# Patient Record
Sex: Female | Born: 1975 | Race: Black or African American | Hispanic: No | Marital: Single | Smoking: Former smoker
Health system: Southern US, Community
[De-identification: ages and names within clinical notes are randomized; demographics above are authoritative.]

## PROBLEM LIST (undated history)

## (undated) DIAGNOSIS — R600 Localized edema: Secondary | ICD-10-CM

## (undated) HISTORY — PX: TONSILLECTOMY: SUR1361

---
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
## 2007-03-10 ENCOUNTER — Emergency Department: Payer: Self-pay | Admitting: Emergency Medicine

## 2007-03-12 ENCOUNTER — Emergency Department: Payer: Self-pay

## 2007-03-13 ENCOUNTER — Emergency Department: Payer: Self-pay | Admitting: Emergency Medicine

## 2007-03-17 ENCOUNTER — Emergency Department: Payer: Self-pay | Admitting: Emergency Medicine

## 2007-11-01 ENCOUNTER — Emergency Department: Payer: Self-pay | Admitting: Emergency Medicine

## 2008-01-05 ENCOUNTER — Emergency Department: Payer: Self-pay | Admitting: Emergency Medicine

## 2008-01-17 ENCOUNTER — Ambulatory Visit: Payer: Self-pay | Admitting: Family Medicine

## 2008-05-23 IMAGING — CT CT ABD-PELV W/O CM
1 of 3 series · 13 of 32 positions shown, 19 images · non-contrast
Comparison: none

REASON FOR EXAM: (1) abd pain; (2) abd pain
COMMENTS:

PROCEDURE:     CT  - CT ABDOMEN AND PELVIS W[DATE]  [DATE]
RESULT:
HISTORY: Abdominal pain.

[Series 6: stone · axial · 0.82mm/px · z∈[-302,-26]mm · 13 of 108 slices shown, 19 images]
[im 8/108  soft-tissue]
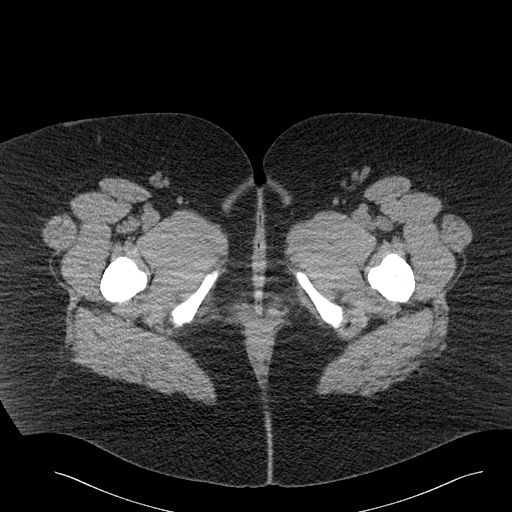
[im 8/108  bone]
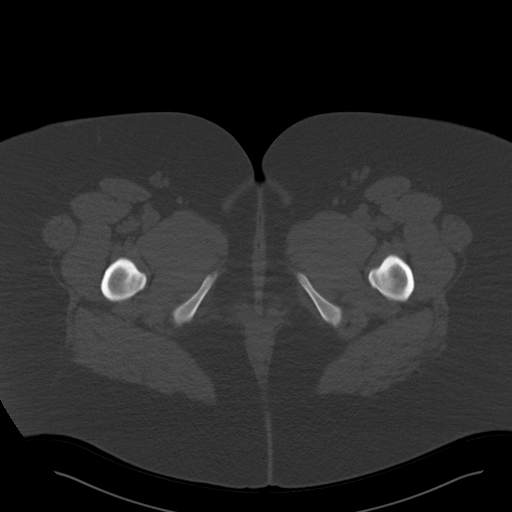
[im 15/108  soft-tissue]
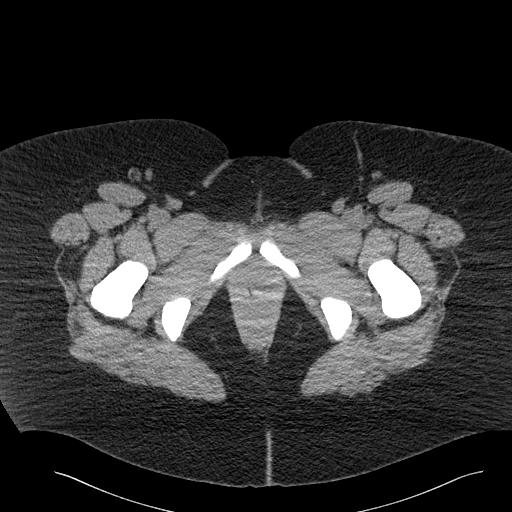
[im 22/108  soft-tissue]
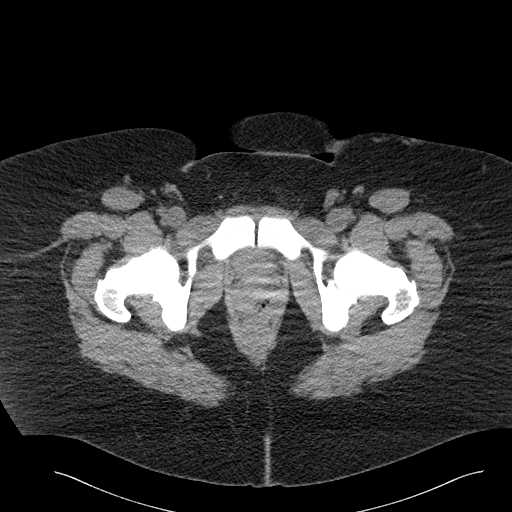
[im 29/108  soft-tissue]
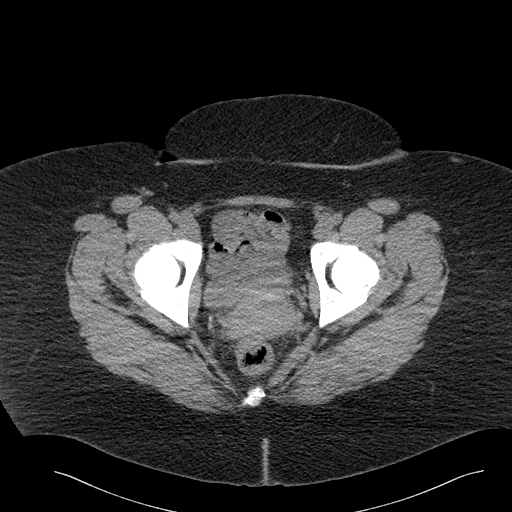
[im 36/108  soft-tissue]
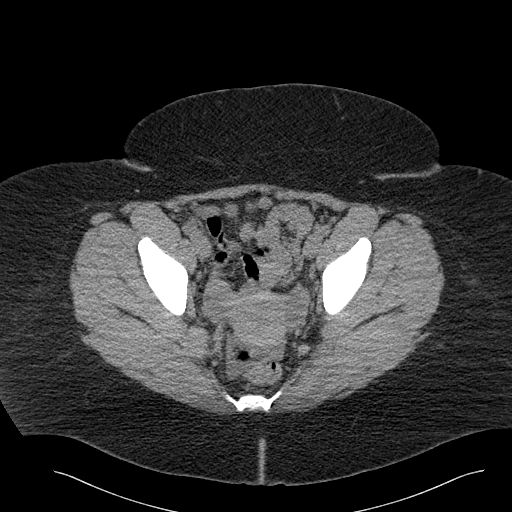
[im 43/108  soft-tissue]
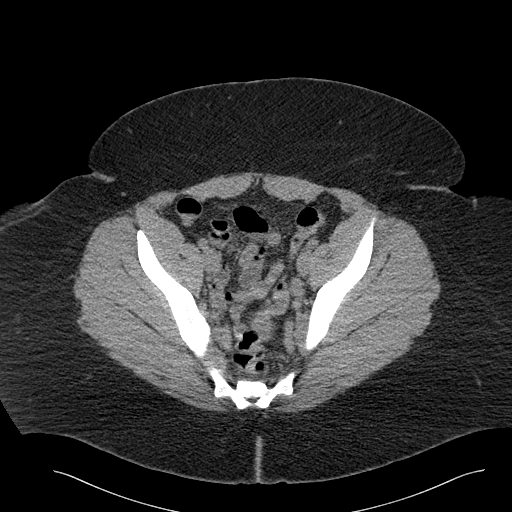
[im 58/108  soft-tissue]
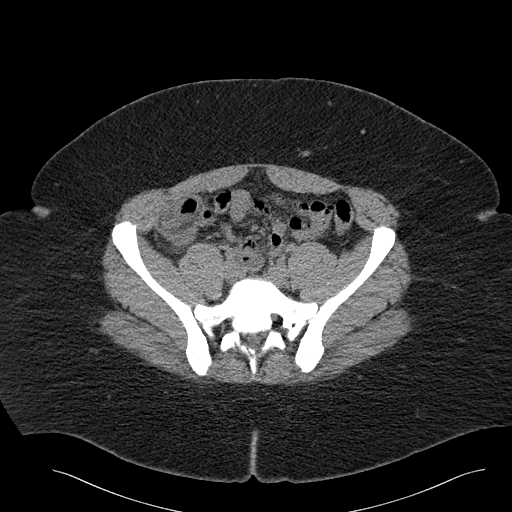
[im 65/108  soft-tissue]
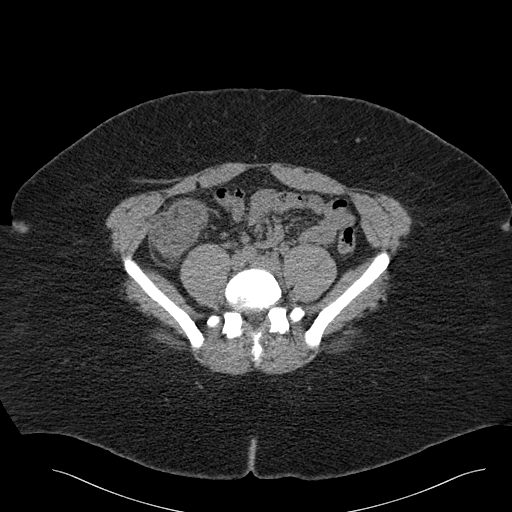
[im 72/108  soft-tissue]
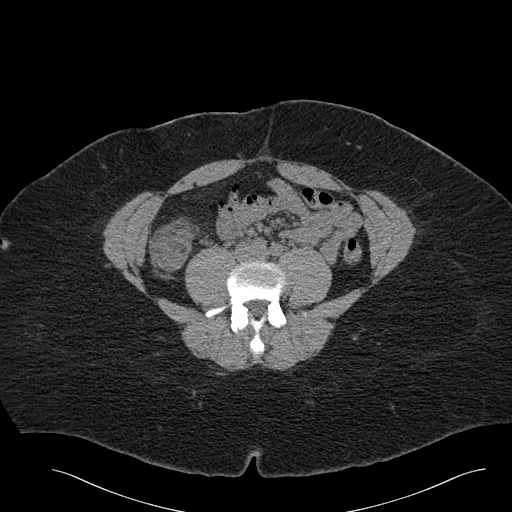
[im 72/108  bone]
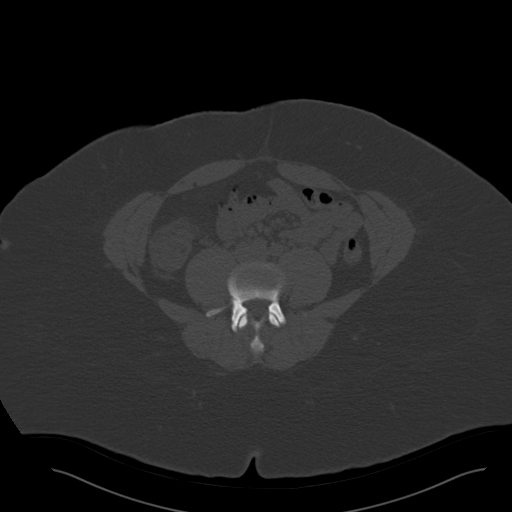
[im 79/108  soft-tissue]
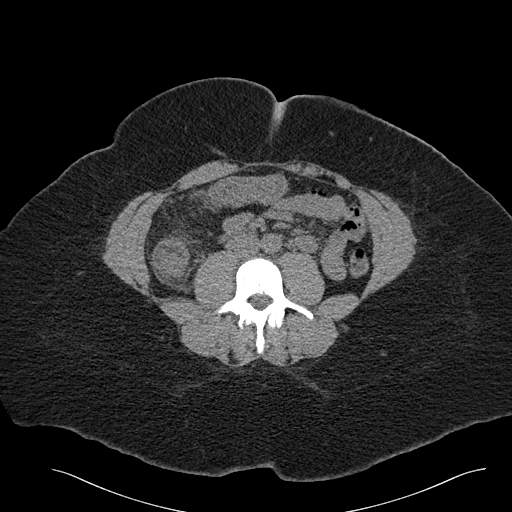
[im 79/108  lung]
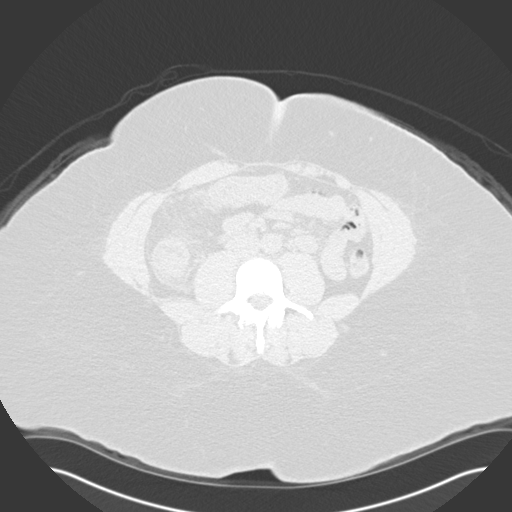
[im 86/108  soft-tissue]
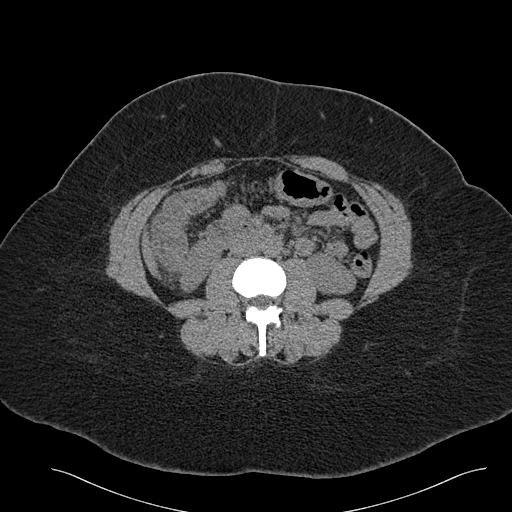
[im 86/108  lung]
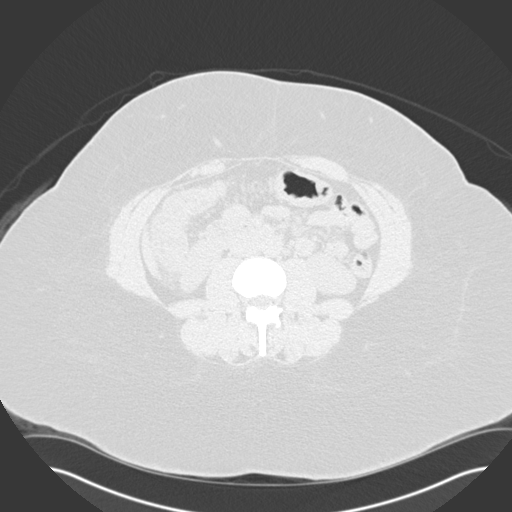
[im 93/108  soft-tissue]
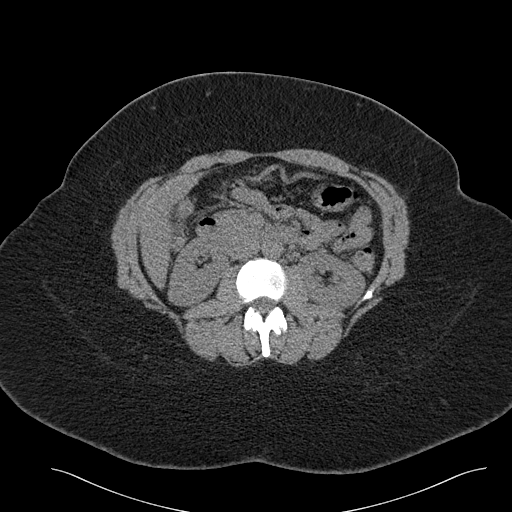
[im 93/108  lung]
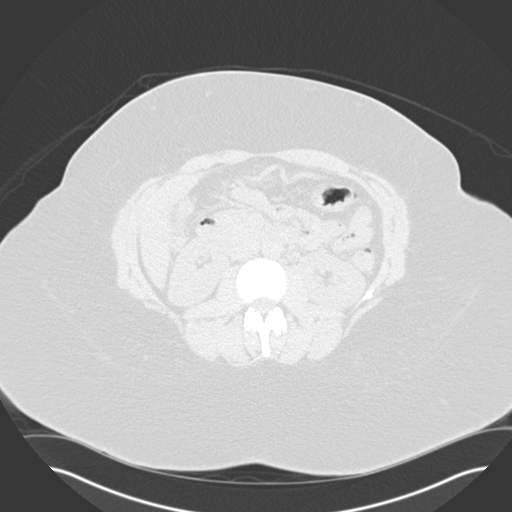
[im 100/108  soft-tissue]
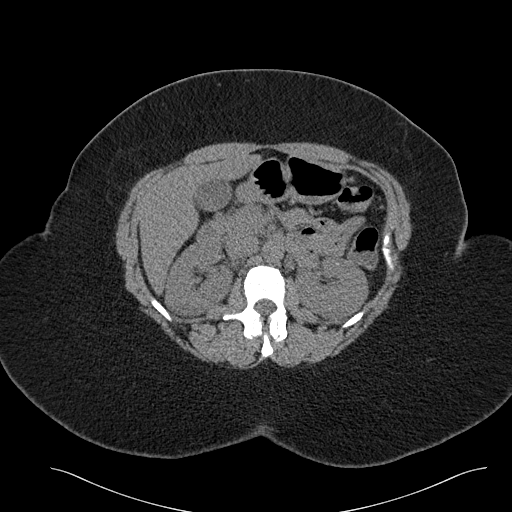
[im 100/108  lung]
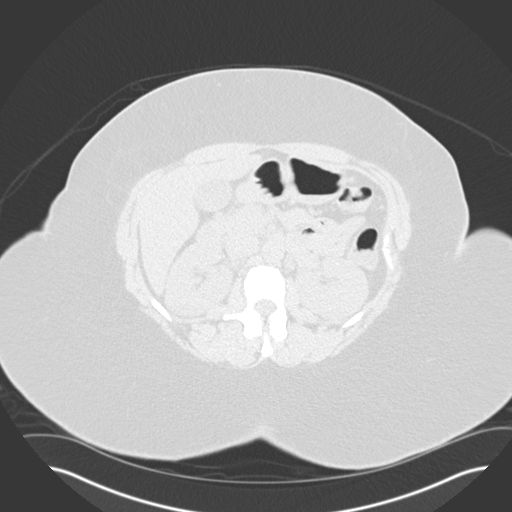

[13 of 32 positions shown; findings below may reference images not displayed]

COMPARISON STUDIES: No recent.

PROCEDURE AND FINDINGS: Standard CT of the abdomen and pelvis is obtained.
The liver and spleen are normal.  The pancreas is normal.  The adrenals are
normal. No focal renal abnormalities are identified.  Mild edema is noted
about the colon on the RIGHT.  What appears to be the appendix is
unremarkable. The edema about the RIGHT colon could be related to
diverticulitis or mild colitis. Malignancy would be less likely. No
hydronephrosis or hydroureter is noted. The pelvis is unremarkable. No
inguinal adenopathy is noted. The lung bases are clear.
IMPRESSION: 1.     Mild edema noted about the RIGHT colon as described above. An oral
contrast-enhanced and IV contrast CT can be obtained if further evaluation
is needed. What appears to be the appendix is normal.
2.     Abdominal aorta and GU system are normal.
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
## 2008-08-23 ENCOUNTER — Emergency Department: Payer: Self-pay | Admitting: Emergency Medicine

## 2011-07-30 ENCOUNTER — Emergency Department: Payer: Self-pay | Admitting: Emergency Medicine

## 2012-02-21 ENCOUNTER — Emergency Department: Payer: Self-pay | Admitting: Emergency Medicine

## 2019-11-29 ENCOUNTER — Other Ambulatory Visit: Payer: Self-pay

## 2019-11-29 ENCOUNTER — Encounter: Payer: Self-pay | Admitting: Physician Assistant

## 2019-11-29 ENCOUNTER — Ambulatory Visit (LOCAL_COMMUNITY_HEALTH_CENTER): Payer: Self-pay | Admitting: Physician Assistant

## 2019-11-29 VITALS — BP 133/83 | Ht 63.0 in | Wt 314.6 lb

## 2019-11-29 DIAGNOSIS — G43829 Menstrual migraine, not intractable, without status migrainosus: Secondary | ICD-10-CM | POA: Insufficient documentation

## 2019-11-29 DIAGNOSIS — Z6841 Body Mass Index (BMI) 40.0 and over, adult: Secondary | ICD-10-CM | POA: Insufficient documentation

## 2019-11-29 DIAGNOSIS — Z309 Encounter for contraceptive management, unspecified: Secondary | ICD-10-CM

## 2019-11-29 HISTORY — DX: Body Mass Index (BMI) 40.0 and over, adult: Z684

## 2019-11-29 HISTORY — DX: Menstrual migraine, not intractable, without status migrainosus: G43.829

## 2019-11-29 LAB — WET PREP FOR TRICH, YEAST, CLUE
Trichomonas Exam: NEGATIVE
Yeast Exam: NEGATIVE

## 2019-11-29 MED ORDER — NORETHINDRONE 0.35 MG PO TABS: 1.0000 | ORAL_TABLET | Freq: Every day | ORAL | 0 refills | Status: DC

## 2019-11-29 NOTE — Progress Notes (Signed)
Patient here for [facility]. Patient states her primary doctor would not give her additional OC and recommended coming to [facility]. States she has been taking OC continuously and should have started new pack on 11/25/19. Needs new prescription for OC. Marland KitchenBurt Knack, RN

## 2019-11-29 NOTE — Progress Notes (Signed)
Family Planning Visit- Repeat Yearly Visit  Subjective:  Caroline Crosby is a 44 y.o. being seen today for an well woman visit and to discuss family planning options.    She is currently using oral progesterone-only contraceptive for pregnancy prevention (ran out a few days ago). Patient reports she does not want a pregnancy in the next year. Patient  has BMI 50.0-59.9, adult (HCC) and Migraine, menstrual on their problem list. States last Pap about 2 years ago and was normal, no hx abnormal. No h/o mammogram [id number], had term C/S 1995 due to breech. Quit smoking >10 y ago. LMP 11/19/19, last sex just prior to that, 1 stable sex partner, always uses condoms. States has been on Sprintec with traditional dosing and likes having regular periods.  Chief Complaint  Patient presents with  . Contraception    OC    Patient reports she feels well today.   Patient denies concerns.   Does the patient desire a pregnancy in the next year? ([facility] flowsheet)  See flowsheet for other program required questions.   Body mass index is 55.73 kg/m. - Patient is eligible for diabetes screening based on BMI and age >86?  yes HA1C ordered? yes  Patient reports 1 of partners in last year. Desires STI screening?  Yes  Does the patient have a current or past history of drug use? No   No components found for: HCV]   Health Maintenance Due  Topic Date Due  . HIV Screening  04/13/1993  . TETANUS/TDAP  04/13/1997  . PAP SMEAR-Modifier  04/14/1999  . INFLUENZA VACCINE  06/02/2019    ROS  The following portions of the patient's history were reviewed and updated as appropriate: allergies, current medications, past family history, past medical history, past social history, past surgical history and problem list. Problem list updated.  Objective:   Vitals:   11/29/19 0900  BP: 133/83  Weight: (!) 314 lb 9.6 oz (142.7 kg)  Height: 5\' 3"  (1.6 m)    Physical Exam Constitutional:      Appearance: She is  obese.  HENT:     Head: Normocephalic.  Pulmonary:     Effort: Pulmonary effort is normal.  Abdominal:     Palpations: Abdomen is soft.  Neurological:     Mental Status: She is alert and oriented to person, place, and time.  Psychiatric:        Mood and Affect: Mood normal.        Behavior: Behavior normal.        Judgment: Judgment normal.       Assessment and Plan:  Caroline Crosby is a 44 y.o. female presenting to the [facility] Department for an initial well woman exam/family planning visit  Contraception counseling: Reviewed all forms of birth control options in the tiered based approach. available including abstinence; over the counter/barrier methods; hormonal contraceptive medication including pill, patch, ring, injection,contraceptive implant; hormonal and nonhormonal IUDs; permanent sterilization options including vasectomy and the various tubal sterilization modalities. Risks, benefits, and typical effectiveness rates were reviewed.  Questions were answered.  Written information was also given to the patient to review.  Patient desires OCPs, pt counseled that progestin-only pill is safest choice in light of h/o migraine HA, this was prescribed for patient. She will follow up in  3 mo for surveillance.  She was told to call with any further questions, or with any concerns about this method of contraception.  Emphasized use of condoms 100% of the  time for STI prevention.  Patient was not offered ECP, as it was not indicated at this visit.    1. Encounter for contraceptive management, unspecified type Micronor 1 po qd #3 packs. ROI for [facility] records re: Pap results last 5 years. - WET PREP FOR [place], YEAST, CLUE - HIV [place] [facility] - Syphilis Serology, [place] [facility] - Chlamydia/Gonorrhea [place] [facility]  2. BMI 50.0-59.9, adult ([place]) Enc nutritionist visit; diet/exercise HgbA1c ordered today.  3. Menstrual migraine without status migrainosus, not intractable Enc to  f/u with PCP, counseled against estrogen-containing OCPs.     Return in about 3 months (around 02/27/2020) for f/u contraceptive management.  No future appointments.  Lora Havens, PA-C

## 2019-11-29 NOTE — Progress Notes (Signed)
Patient given 3 packs Micronor per provider VO. Patient plans to apply for [facility] Medicaid, and told to call [facility] when she starts 3rd pack. Pharmacy verified. Patient counseled that depending on Medicaid status at that time, Patient will either come to clinic for more OC or prescription will be electronically sent to her pharmacy. ROI's faxed for last Pap and PE to [facility] and to [facility].Burt Knack, RN

## 2019-12-24 ENCOUNTER — Encounter: Payer: Self-pay | Admitting: Physician Assistant

## 2019-12-24 NOTE — Progress Notes (Signed)
Received records from [facility] for patient spanning from 04/2018 through 04/2019.  Per note from patient last visit, records of last pap smears were to be requested.  The records received did not contain any pap records and were shredded since they did not contain the requested information.

## 2019-12-24 NOTE — Progress Notes (Signed)
Per records from [facility], patient had pap there 2016, 2017, and 2018.  Last PE was 2018.  Pap results to be added to chart and PE in 2017 and 2018, along with pap results to be scanned into chart.

## 2020-01-24 NOTE — Addendum Note (Signed)
Addended by: Heywood Bene on: 01/24/2020 10:29 AM   Modules accepted: Orders

## 2021-12-17 ENCOUNTER — Encounter: Payer: Self-pay | Admitting: Family Medicine

## 2021-12-17 ENCOUNTER — Ambulatory Visit (LOCAL_COMMUNITY_HEALTH_CENTER): Payer: BC Managed Care – PPO | Admitting: Family Medicine

## 2021-12-17 ENCOUNTER — Other Ambulatory Visit: Payer: Self-pay

## 2021-12-17 VITALS — BP 132/75 | Ht 63.0 in | Wt 311.2 lb

## 2021-12-17 DIAGNOSIS — Z1272 Encounter for screening for malignant neoplasm of vagina: Secondary | ICD-10-CM

## 2021-12-17 DIAGNOSIS — A599 Trichomoniasis, unspecified: Secondary | ICD-10-CM

## 2021-12-17 DIAGNOSIS — R635 Abnormal weight gain: Secondary | ICD-10-CM

## 2021-12-17 DIAGNOSIS — Z01419 Encounter for gynecological examination (general) (routine) without abnormal findings: Secondary | ICD-10-CM

## 2021-12-17 DIAGNOSIS — Z3009 Encounter for other general counseling and advice on contraception: Secondary | ICD-10-CM

## 2021-12-17 DIAGNOSIS — Z113 Encounter for screening for infections with a predominantly sexual mode of transmission: Secondary | ICD-10-CM

## 2021-12-17 LAB — WET PREP FOR TRICH, YEAST, CLUE
Trichomonas Exam: POSITIVE — AB
Yeast Exam: NEGATIVE

## 2021-12-17 LAB — HM HIV SCREENING LAB: HM HIV Screening: NEGATIVE

## 2021-12-17 MED ORDER — METRONIDAZOLE 500 MG PO TABS: 500.0000 mg | ORAL_TABLET | Freq: Two times a day (BID) | ORAL | 0 refills | Status: AC

## 2021-12-17 NOTE — Progress Notes (Signed)
[place] [facility] DEPARTMENT [facility] [address] Main Number: [phone]    Family Planning Visit- Initial Visit  Subjective:  Caroline Crosby is a 46 y.o.  G4P0   being seen today for an initial annual visit and to discuss reproductive life planning.  The patient is currently using No Method - No Contraceptive Precautions for pregnancy prevention. Patient reports   does not want a pregnancy in the next year.  Patient has the following medical conditions has BMI 50.0-59.9, adult (HCC) and Migraine, menstrual on their problem list.  Chief Complaint  Patient presents with   Annual Exam    Patient reports here for physical, pap, and sti testing   Patient denies any concerns    Body mass index is 55.13 kg/m. - Patient is eligible for diabetes screening based on BMI and age >78?  yes HA1C ordered? No, pt declined, pt wants to have done at PCP.   Patient reports 2  partner/s in last year. Desires STI screening?  Yes  Has patient been screened once for HCV in the past?  No  No results found for: HCVAB  Does the patient have current drug use (including MJ), have a partner with drug use, and/or has been incarcerated since last result? Yes  If yes-- Screen for HCV through [facility]   Does the patient meet criteria for HBV testing? Yes, pt declines   Criteria:  -Household, sexual or needle sharing contact with HBV -History of drug use -HIV positive -Those with known Hep C   Health Maintenance Due  Topic Date Due   COVID-19 Vaccine (1) Never done   HIV Screening  Never done   Hepatitis C Screening  Never done   TETANUS/TDAP  Never done   INFLUENZA VACCINE  Never done    Review of Systems  Constitutional:  Negative for chills, fever, malaise/fatigue and weight loss.  HENT:  Negative for congestion, hearing loss and sore throat.   Eyes:  Negative for blurred vision, double vision and photophobia.  Respiratory:  Positive for wheezing.  Negative for shortness of breath.        Pt dx with bronchitis   Cardiovascular:  Negative for chest pain.  Gastrointestinal:  Negative for abdominal pain, blood in stool, constipation, diarrhea, heartburn, nausea and vomiting.  Genitourinary:  Negative for dysuria and frequency.  Musculoskeletal:  Negative for back pain, joint pain and neck pain.  Skin:  Negative for itching and rash.  Neurological:  Negative for dizziness, weakness and headaches.  Endo/Heme/Allergies:  Does not bruise/bleed easily.  Psychiatric/Behavioral:  Negative for depression, substance abuse and suicidal ideas.    The following portions of the patient's history were reviewed and updated as appropriate: allergies, current medications, past family history, past medical history, past social history, past surgical history and problem list. Problem list updated.   See flowsheet for other program required questions.  Objective:   Vitals:   12/17/21 0916  BP: 132/75  Weight: (!) 311 lb 3.2 oz (141.2 kg)  Height: 5\' 3"  (1.6 m)    Physical Exam Vitals and nursing note reviewed.  Constitutional:      Appearance: Normal appearance. She is obese.  HENT:     Head: Normocephalic and atraumatic.     Mouth/Throat:     Mouth: Mucous membranes are moist.     Pharynx: No oropharyngeal exudate or posterior oropharyngeal erythema.  Eyes:     General: No scleral icterus. Cardiovascular:  Rate and Rhythm: Normal rate and regular rhythm.     Pulses: Normal pulses.     Heart sounds: Normal heart sounds.  Pulmonary:     Effort: Pulmonary effort is normal.     Breath sounds: Normal breath sounds.  Abdominal:     General: Bowel sounds are normal.     Palpations: Abdomen is soft.  Genitourinary:    General: Normal vulva.     Rectum: Normal.     Comments: External genitalia without, lice, nits, erythema, edema , lesions or inguinal adenopathy. Vagina with normal mucosa and discharge and pH equals >4.  Cervix  with  petechiae and friable, no visual lesions, uterus firm, mobile, non-tender, no masses, CMT adnexal fullness or tenderness.   Musculoskeletal:        General: No swelling. Normal range of motion.     Cervical back: Normal range of motion and neck supple.     Right lower leg: Edema present.     Left lower leg: Edema present.  Skin:    General: Skin is warm and dry.  Neurological:     General: No focal deficit present.     Mental Status: She is alert and oriented to person, place, and time.  Psychiatric:        Mood and Affect: Mood normal.        Behavior: Behavior normal.     Assessment and Plan:  Caroline Crosby is a 46 y.o. female presenting to the [facility] Department for an initial annual wellness/contraceptive visit  Contraception counseling: Reviewed all forms of birth control options in the tiered based approach. available including abstinence; over the counter/barrier methods; hormonal contraceptive medication including pill, patch, ring, injection,contraceptive implant, ECP; hormonal and nonhormonal IUDs; permanent sterilization options including vasectomy and the various tubal sterilization modalities. Risks, benefits, and typical effectiveness rates were reviewed.  Questions were answered.  Written information was also given to the patient to review.  Patient desires No Method - No Contraceptive Precautions, this was prescribed for patient.    The patient will follow up in  as needed when ready for [facility].  The patient was told to call with any further questions, or with any concerns about this method of contraception.  Emphasized use of condoms 100% of the time for STI prevention.  Patient was not offered offered ECP based on patient preference.     1. Smear, vaginal, as part of routine gynecological examination Well woman exam CBE today  Pap today, per record last pap was 2018 @ [facility].  Unable to see result in care everywhere.   - IGP, Aptima HPV  2. Screening  examination for venereal disease Patient accepted all screenings including wet prep, oral, vaginal CT/GC and bloodwork for HIV/RPR.   Wet prep results  + trich    Treatment needed Discussed time line for [employer] results and that patient will be called with positive results and encouraged patient to call if she had not heard in 2 weeks.  Counseled to return or seek care for continued or worsening symptoms Recommended  not sex for the next 14 days and condom use with all sex   - HIV [place] [facility] - Syphilis Serology, [place] [facility] - WET PREP FOR TRICH, YEAST, CLUE - Chlamydia/Gonorrhea  Lab  3. Trichimoniasis  - metroNIDAZOLE (FLAGYL) 500 MG tablet; Take 1 tablet (500 mg total) by mouth 2 (two) times daily for 7 days.  Dispense: 14 tablet; Refill: 0  4.  Weight gain Pt reports about a 20 lb weight gain since 09/2021.  Pt reports that PCP has her on a "fluid " pill to take when her legs feel heavy.   Recommended to follow up with PCP,  BLE were edematous today.   Return in about 1 year (around 12/17/2022), or if symptoms worsen or fail to improve, for annual well woman exam.  No future appointments.  Wendi Mazzuca, FNP

## 2021-12-17 NOTE — Progress Notes (Signed)
Pt here for PE and Pap.  Wet mount results reviewed and medication dispensed per Provider orders.  Berdie Ogren, RN

## 2021-12-22 LAB — IGP, APTIMA HPV
HPV Aptima: NEGATIVE
PAP Smear Comment: 0

## 2022-03-30 ENCOUNTER — Other Ambulatory Visit: Payer: Self-pay | Admitting: Obstetrics and Gynecology

## 2022-04-07 NOTE — H&P (Signed)
Preoperative History and Physical   Caroline Crosby is a 46 y.o. [id number] here for surgical management of desire for permanent sterilization.   No significant preoperative concerns.   History of Present Illness: Caroline Crosby is a 46 y.o. G4P1 who is currently on no form of contraception and desiring to have a  Tubal ligation.  She has a past medical history significant for no issues.  She specifically denies a history of migraine with aura, chronic hypertension, history of DVT/PE and smoking.  Reported Caroline Crosby's last menstrual period was 03/08/2022 (exact date)..    She has regular periods, monthly, lasting 3-4 days.  She does have bad headaches and bad back pain.    Proposed surgery: robot assisted laparoscopic bilateral salpingectomy       Past Medical History:  Diagnosis Date   History of trichomoniasis           Past Surgical History:  Procedure Laterality Date   TONSILLECTOMY   1988   CESAREAN DELIVERY   1995                     OB History  Gravida Para Term Preterm AB Living  4 1     3 1   SAB IAB Ectopic Molar Multiple Live Births             1     # Outcome Date GA Lbr Len/2nd Weight Sex Delivery Anes PTL Lv  4 AB                    3 AB                    2 AB                    1 Para                 LIV  Caroline Crosby denies any other pertinent gynecologic issues.          Current Outpatient Medications on File Prior to Visit  Medication Sig Dispense Refill   FUROsemide (LASIX) 20 MG tablet Take 1 tablet (20 mg total) by mouth once daily as needed for Edema 30 tablet 3    No current facility-administered medications on file prior to visit.        Allergies  Allergen Reactions   Venom-Honey Bee Anaphylaxis      Social History:   reports that she has quit smoking. She has been exposed to tobacco smoke. She has never used smokeless tobacco. She reports current alcohol use of about 3.0 standard drinks per week. She reports current drug use. Drug: Marijuana.        Family  History  Problem Relation Age of Onset   High blood pressure (Hypertension) Mother          cholesterol issue, skin cancer twice   No Known Problems Father     No Known Problems Sister     No Known Problems Brother     No Known Problems Brother     Diabetes type II Maternal Aunt     Diabetes type II Maternal Uncle     Diabetes type II Maternal Grandmother        Review of Systems: Noncontributory   PHYSICAL EXAM: Blood pressure (!) 142/96, pulse 81, height 158.8 cm (5' 2.52"), weight (!) 133.6 kg (294 lb 9.6 oz), last menstrual period 03/08/2022. CONSTITUTIONAL: Well-developed, well-nourished female in no acute distress.  HENT:  Normocephalic, atraumatic, External right and left ear normal. Oropharynx is clear and moist EYES: Conjunctivae and EOM are normal. Pupils are equal, round, and reactive to light. No scleral icterus.  NECK: Normal range of motion, supple, no masses SKIN: Skin is warm and dry. No rash noted. Not diaphoretic. No erythema. No pallor. NEUROLGIC: Alert and oriented to person, place, and time. Normal reflexes, muscle tone coordination. No cranial nerve deficit noted. PSYCHIATRIC: Normal mood and affect. Normal behavior. Normal judgment and thought content. CARDIOVASCULAR: Normal heart rate noted, regular rhythm RESPIRATORY: Effort and breath sounds normal, no problems with respiration noted ABDOMEN: Soft, nontender, nondistended. PELVIC: Deferred MUSCULOSKELETAL: Normal range of motion. No edema and no tenderness. 2+ distal pulses.   Labs: Recent Results  No results found for this or any previous visit (from the past 336 hour(s)).     Imaging Studies: No results found.   Assessment: 1. Request for sterilization     Plan: Caroline Crosby will undergo surgical management with the above surgery.   The risks of surgery were discussed in detail with the Caroline Crosby including but not limited to: bleeding which may require transfusion or reoperation; infection which may  require antibiotics; injury to surrounding organs which may involve bowel, bladder, ureters ; need for additional procedures including laparoscopy or laparotomy; thromboembolic phenomenon, surgical site problems and other postoperative/anesthesia complications. Likelihood of success in alleviating the Caroline Crosby's condition was discussed. Routine postoperative instructions will be reviewed with the Caroline Crosby and her family in detail after surgery.  The Caroline Crosby concurred with the proposed plan, giving informed written consent for the surgery.  Preoperative prophylactic antibiotics, as indicated, and SCDs ordered on call to the OR.       Attestation Statement:    I personally performed the service. (TP)   Stpehen Petitjean Teola Bradley, MD  [facility] OB[facility] 04/07/2022 4:45 PM

## 2022-04-12 ENCOUNTER — Encounter
Admission: RE | Admit: 2022-04-12 | Discharge: 2022-04-12 | Disposition: A | Discharge status: Home / Self Care | Payer: BC Managed Care – PPO | Source: Ambulatory Visit | Attending: Obstetrics and Gynecology | Admitting: Obstetrics and Gynecology

## 2022-04-12 VITALS — Ht 63.0 in | Wt 294.0 lb

## 2022-04-12 DIAGNOSIS — Z01812 Encounter for preprocedural laboratory examination: Secondary | ICD-10-CM | POA: Diagnosis present

## 2022-04-12 DIAGNOSIS — Z302 Encounter for sterilization: Secondary | ICD-10-CM

## 2022-04-12 HISTORY — DX: Morbid (severe) obesity due to excess calories: E66.01

## 2022-04-12 LAB — TYPE AND SCREEN
ABO/RH(D): O NEG
Antibody Screen: NEGATIVE

## 2022-04-12 NOTE — Patient Instructions (Addendum)
Your procedure is scheduled on: Tuesday, June 20 Report to the Registration Desk on the 1st floor of the [facility] To find out your arrival time, please call [phone] between 1PM - 3PM on: Monday, June 19 If your arrival time is 6:00 am, do not arrive prior to that time as the [facility] entrance doors do not open until 6:00 am.  REMEMBER: Instructions that are not followed completely may result in serious medical risk, up to and including death; or upon the discretion of your surgeon and anesthesiologist your surgery may need to be rescheduled.  Do not eat food after midnight the night before surgery.  No gum chewing, lozengers or hard candies.  You may however, drink CLEAR liquids up to 2 hours before you are scheduled to arrive for your surgery. Do not drink anything within 2 hours of your scheduled arrival time.  Clear liquids include: - water  - apple juice without pulp - gatorade (not RED colors) - black coffee or tea (Do NOT add milk or creamers to the coffee or tea) Do NOT drink anything that is not on this list.  In addition, your doctor has ordered for you to drink the provided  Ensure Pre-Surgery Clear Carbohydrate Drink  Drinking this carbohydrate drink up to two hours before surgery helps to reduce insulin resistance and improve patient outcomes. Please complete drinking 2 hours prior to scheduled arrival time.  DO NOT TAKE ANY MEDICATIONS THE MORNING OF SURGERY  One week prior to surgery: starting June 13 Stop Anti-inflammatories (NSAIDS) such as Advil, Aleve, Ibuprofen, Motrin, Naproxen, Naprosyn and Aspirin based products such as Excedrin, Goodys Powder, BC Powder. Stop ANY OVER THE COUNTER supplements until after surgery. You may however, continue to take Tylenol if needed for pain up until the day of surgery.  No Alcohol for 24 hours before or after surgery.  No Smoking including e-cigarettes for 24 hours prior to surgery.  No chewable tobacco products  for at least 6 hours prior to surgery.  No nicotine patches on the day of surgery.  Do not use any "recreational" drugs for at least a week prior to your surgery.  Please be advised that the combination of cocaine and anesthesia may have negative outcomes, up to and including death. If you test positive for cocaine, your surgery will be cancelled.  On the morning of surgery brush your teeth with toothpaste and water, you may rinse your mouth with mouthwash if you wish. Do not swallow any toothpaste or mouthwash.  Use CHG Soap as directed on instruction sheet.  Do not wear jewelry, make-up, hairpins, clips or nail polish.  Do not wear lotions, powders, or perfumes.   Do not shave body from the neck down 48 hours prior to surgery just in case you cut yourself which could leave a site for infection.  Also, freshly shaved skin may become irritated if using the CHG soap.  Contact lenses, hearing aids and dentures may not be worn into surgery.  Do not bring valuables to the hospital. [facility] is not responsible for any missing/lost belongings or valuables.   Notify your doctor if there is any change in your medical condition (cold, fever, infection).  Wear comfortable clothing (specific to your surgery type) to the hospital.  After surgery, you can help prevent lung complications by doing breathing exercises.  Take deep breaths and cough every 1-2 hours. Your doctor may order a device called an Incentive Spirometer to help you take deep breaths. When  coughing or sneezing, hold a pillow firmly against your incision with both hands. This is called "splinting." Doing this helps protect your incision. It also decreases belly discomfort.  If you are being discharged the day of surgery, you will not be allowed to drive home. You will need a responsible adult (18 years or older) to drive you home and stay with you that night.   If you are taking public transportation, you will need to have a  responsible adult (18 years or older) with you. Please confirm with your physician that it is acceptable to use public transportation.   Please call the Pre-admissions Testing Dept. at [phone] if you have any questions about these instructions.  Surgery Visitation Policy:  Patients undergoing a surgery or procedure may have two family members or support persons with them as long as the person is not COVID-19 positive or experiencing its symptoms.   Preparing for Surgery with CHLORHEXIDINE GLUCONATE (CHG) Soap    Before surgery, you can play an important role by reducing the number of germs on your skin.  CHG (Chlorhexidine gluconate) soap is an antiseptic cleanser which kills germs and bonds with the skin to continue killing germs even after washing.  Please do not use if you have an allergy to CHG or antibacterial soaps. If your skin becomes reddened/irritated stop using the CHG.  1. Shower the NIGHT BEFORE SURGERY and the MORNING OF SURGERY with CHG soap.  2. If you choose to wash your hair, wash your hair first as usual with your normal shampoo.  3. After shampooing, rinse your hair and body thoroughly to remove the shampoo.  4. Use CHG as you would any other liquid soap. You can apply CHG directly to the skin and wash gently with a scrungie or a clean washcloth.  5. Apply the CHG soap to your body only from the neck down. Do not use on open wounds or open sores. Avoid contact with your eyes, ears, mouth, and genitals (private parts). Wash face and genitals (private parts) with your normal soap.  6. Wash thoroughly, paying special attention to the area where your surgery will be performed.  7. Thoroughly rinse your body with warm water.  8. Do not shower/wash with your normal soap after using and rinsing off the CHG soap.  9. Pat yourself dry with a clean towel.  10. Wear clean pajamas to bed the night before surgery.  12. Place clean sheets on your bed the night of  your first shower and do not sleep with pets.  13. Shower again with the CHG soap on the day of surgery prior to arriving at the hospital.  14. Do not apply any deodorants/lotions/powders.  15. Please wear clean clothes to the hospital.

## 2022-04-19 MED ORDER — GABAPENTIN 600 MG PO TABS
300.0000 mg | ORAL_TABLET | Freq: Once | ORAL | Status: DC
  Filled 2022-04-19: qty 0.5

## 2022-04-19 MED ORDER — CELECOXIB 200 MG PO CAPS: 200.0000 mg | ORAL_CAPSULE | Freq: Once | ORAL | Status: AC

## 2022-04-19 MED ORDER — CHLORHEXIDINE GLUCONATE 0.12 % MT SOLN: 15.0000 mL | Freq: Once | OROMUCOSAL | Status: AC

## 2022-04-19 MED ORDER — LACTATED RINGERS IV SOLN: INTRAVENOUS | Status: DC

## 2022-04-19 MED ORDER — FAMOTIDINE 20 MG PO TABS: 20.0000 mg | ORAL_TABLET | Freq: Once | ORAL | Status: AC

## 2022-04-19 MED ORDER — ACETAMINOPHEN 500 MG PO TABS: 1000.0000 mg | ORAL_TABLET | Freq: Once | ORAL | Status: AC

## 2022-04-19 MED ORDER — ORAL CARE MOUTH RINSE: 15.0000 mL | Freq: Once | OROMUCOSAL | Status: AC

## 2022-04-19 NOTE — Anesthesia Preprocedure Evaluation (Addendum)
Anesthesia Evaluation  Patient identified by MRN, date of birth, ID band Patient awake    Reviewed: Allergy & Precautions, NPO status , Patient's Chart, lab work & pertinent test results  Airway Mallampati: III  TM Distance: >3 FB Neck ROM: full    Dental no notable dental hx.    Pulmonary neg pulmonary ROS, Patient abstained from smoking., former smoker,    Pulmonary exam normal        Cardiovascular Exercise Tolerance: Good Normal cardiovascular exam     Neuro/Psych  Headaches, negative psych ROS   GI/Hepatic negative GI ROS, (+)     substance abuse (h/o MDMA (Ecstacy))  marijuana use,   Endo/Other  Morbid obesity  Renal/GU      Musculoskeletal   Abdominal (+) + obese,   Peds  Hematology negative hematology ROS (+)   Anesthesia Other Findings Past Medical History: 11/29/2019: BMI 50.0-59.9, adult (HCC) 11/29/2019: Migraine, menstrual No date: Morbid obesity with BMI of 50.0-59.9, adult [facility])  Past Surgical History: 1995: CESAREAN SECTION 1988: TONSILLECTOMY     Reproductive/Obstetrics negative OB ROS                           Anesthesia Physical Anesthesia Plan  ASA: 3  Anesthesia Plan: General ETT   Post-op Pain Management: Tylenol PO (pre-op)*, Celebrex PO (pre-op)* and Gabapentin PO (pre-op)*   Induction: Intravenous  PONV Risk Score and Plan: 4 or greater and Ondansetron, Dexamethasone, Midazolam and Promethazine  Airway Management Planned: Oral ETT  Additional Equipment:   Intra-op Plan:   Post-operative Plan: Extubation in OR  Informed Consent: I have reviewed the patients History and Physical, chart, labs and discussed the procedure including the risks, benefits and alternatives for the proposed anesthesia with the patient or authorized representative who has indicated his/her understanding and acceptance.     Dental Advisory Given  Plan Discussed with:  Anesthesiologist, CRNA and Surgeon  Anesthesia Plan Comments:       Anesthesia Quick Evaluation

## 2022-04-20 ENCOUNTER — Other Ambulatory Visit: Payer: Self-pay

## 2022-04-20 ENCOUNTER — Ambulatory Visit
Admission: RE | Admit: 2022-04-20 | Discharge: 2022-04-20 | Disposition: A | Discharge status: Home / Self Care | Payer: BC Managed Care – PPO | Attending: Obstetrics and Gynecology | Admitting: Obstetrics and Gynecology

## 2022-04-20 ENCOUNTER — Encounter
Admission: RE | Disposition: A | Discharge status: Home / Self Care | Payer: Self-pay | Source: Home / Self Care | Attending: Obstetrics and Gynecology

## 2022-04-20 ENCOUNTER — Encounter: Payer: Self-pay | Admitting: Obstetrics and Gynecology

## 2022-04-20 ENCOUNTER — Ambulatory Visit: Payer: BC Managed Care – PPO | Admitting: Anesthesiology

## 2022-04-20 DIAGNOSIS — Z01812 Encounter for preprocedural laboratory examination: Secondary | ICD-10-CM

## 2022-04-20 DIAGNOSIS — Z6841 Body Mass Index (BMI) 40.0 and over, adult: Secondary | ICD-10-CM | POA: Diagnosis not present

## 2022-04-20 DIAGNOSIS — Z302 Encounter for sterilization: Secondary | ICD-10-CM | POA: Diagnosis present

## 2022-04-20 DIAGNOSIS — Z87891 Personal history of nicotine dependence: Secondary | ICD-10-CM | POA: Diagnosis not present

## 2022-04-20 DIAGNOSIS — F1290 Cannabis use, unspecified, uncomplicated: Secondary | ICD-10-CM | POA: Insufficient documentation

## 2022-04-20 HISTORY — PX: XI ROBOTIC ASSISTED SALPINGECTOMY: SHX6824

## 2022-04-20 LAB — URINE DRUG SCREEN, QUALITATIVE (ARMC ONLY)
Amphetamines, Ur Screen: NOT DETECTED
Barbiturates, Ur Screen: NOT DETECTED
Benzodiazepine, Ur Scrn: NOT DETECTED
Cannabinoid 50 Ng, Ur ~~LOC~~: POSITIVE — AB
Cocaine Metabolite,Ur ~~LOC~~: NOT DETECTED
MDMA (Ecstasy)Ur Screen: NOT DETECTED
Methadone Scn, Ur: NOT DETECTED
Opiate, Ur Screen: NOT DETECTED
Phencyclidine (PCP) Ur S: NOT DETECTED
Tricyclic, Ur Screen: NOT DETECTED

## 2022-04-20 LAB — PREGNANCY, URINE: Preg Test, Ur: NEGATIVE

## 2022-04-20 LAB — ABO/RH: ABO/RH(D): O NEG

## 2022-04-20 LAB — POCT PREGNANCY, URINE: Preg Test, Ur: NEGATIVE

## 2022-04-20 SURGERY — SALPINGECTOMY, ROBOT-ASSISTED
Anesthesia: General | Site: Abdomen | Laterality: Bilateral

## 2022-04-20 MED ORDER — FENTANYL CITRATE (PF) 100 MCG/2ML IJ SOLN
INTRAMUSCULAR | Status: DC | PRN
  Administered 2022-04-20: 100 ug via INTRAVENOUS

## 2022-04-20 MED ORDER — LIDOCAINE HCL (PF) 2 % IJ SOLN
INTRAMUSCULAR | Status: AC
  Filled 2022-04-20: qty 5

## 2022-04-20 MED ORDER — MIDAZOLAM HCL 2 MG/2ML IJ SOLN
INTRAMUSCULAR | Status: DC | PRN
  Administered 2022-04-20: 2 mg via INTRAVENOUS

## 2022-04-20 MED ORDER — 0.9 % SODIUM CHLORIDE (POUR BTL) OPTIME
TOPICAL | Status: DC | PRN
  Administered 2022-04-20: 500 mL

## 2022-04-20 MED ORDER — PROPOFOL 10 MG/ML IV BOLUS
INTRAVENOUS | Status: AC
  Filled 2022-04-20: qty 20

## 2022-04-20 MED ORDER — ACETAMINOPHEN 10 MG/ML IV SOLN: 1000.0000 mg | Freq: Once | INTRAVENOUS | Status: DC | PRN

## 2022-04-20 MED ORDER — IBUPROFEN 600 MG PO TABS: 600.0000 mg | ORAL_TABLET | Freq: Four times a day (QID) | ORAL | 0 refills | Status: AC

## 2022-04-20 MED ORDER — DROPERIDOL 2.5 MG/ML IJ SOLN: 0.6250 mg | Freq: Once | INTRAMUSCULAR | Status: DC | PRN

## 2022-04-20 MED ORDER — ROCURONIUM BROMIDE 100 MG/10ML IV SOLN
INTRAVENOUS | Status: DC | PRN
  Administered 2022-04-20: 60 mg via INTRAVENOUS
  Administered 2022-04-20: 20 mg via INTRAVENOUS

## 2022-04-20 MED ORDER — ACETAMINOPHEN 500 MG PO TABS
ORAL_TABLET | ORAL | Status: AC
  Administered 2022-04-20: 1000 mg via ORAL
  Filled 2022-04-20: qty 2

## 2022-04-20 MED ORDER — FAMOTIDINE 20 MG PO TABS
ORAL_TABLET | ORAL | Status: AC
  Administered 2022-04-20: 20 mg via ORAL
  Filled 2022-04-20: qty 1

## 2022-04-20 MED ORDER — BUPIVACAINE HCL 0.5 % IJ SOLN
INTRAMUSCULAR | Status: DC | PRN
  Administered 2022-04-20: 14 mL

## 2022-04-20 MED ORDER — LIDOCAINE HCL (CARDIAC) PF 100 MG/5ML IV SOSY
PREFILLED_SYRINGE | INTRAVENOUS | Status: DC | PRN
  Administered 2022-04-20: 100 mg via INTRAVENOUS

## 2022-04-20 MED ORDER — SUGAMMADEX SODIUM 500 MG/5ML IV SOLN
INTRAVENOUS | Status: AC
  Filled 2022-04-20: qty 5

## 2022-04-20 MED ORDER — DEXAMETHASONE SODIUM PHOSPHATE 10 MG/ML IJ SOLN
INTRAMUSCULAR | Status: AC
  Filled 2022-04-20: qty 1

## 2022-04-20 MED ORDER — HYDROCODONE-ACETAMINOPHEN 5-325 MG PO TABS: 1.0000 | ORAL_TABLET | Freq: Four times a day (QID) | ORAL | 0 refills | Status: AC | PRN

## 2022-04-20 MED ORDER — EPHEDRINE SULFATE (PRESSORS) 50 MG/ML IJ SOLN
INTRAMUSCULAR | Status: DC | PRN
  Administered 2022-04-20: 5 mg via INTRAVENOUS

## 2022-04-20 MED ORDER — ONDANSETRON HCL 4 MG/2ML IJ SOLN
INTRAMUSCULAR | Status: DC | PRN
  Administered 2022-04-20: 4 mg via INTRAVENOUS

## 2022-04-20 MED ORDER — OXYCODONE HCL 5 MG/5ML PO SOLN: 5.0000 mg | Freq: Once | ORAL | Status: DC | PRN

## 2022-04-20 MED ORDER — PROPOFOL 10 MG/ML IV BOLUS
INTRAVENOUS | Status: DC | PRN
  Administered 2022-04-20: 200 mg via INTRAVENOUS

## 2022-04-20 MED ORDER — FENTANYL CITRATE (PF) 100 MCG/2ML IJ SOLN
INTRAMUSCULAR | Status: AC
  Filled 2022-04-20: qty 2

## 2022-04-20 MED ORDER — FENTANYL CITRATE (PF) 100 MCG/2ML IJ SOLN: 25.0000 ug | INTRAMUSCULAR | Status: DC | PRN

## 2022-04-20 MED ORDER — SUGAMMADEX SODIUM 500 MG/5ML IV SOLN
INTRAVENOUS | Status: DC | PRN
  Administered 2022-04-20: 300 mg via INTRAVENOUS

## 2022-04-20 MED ORDER — MIDAZOLAM HCL 2 MG/2ML IJ SOLN
INTRAMUSCULAR | Status: AC
  Filled 2022-04-20: qty 2

## 2022-04-20 MED ORDER — EPHEDRINE 5 MG/ML INJ
INTRAVENOUS | Status: AC
  Filled 2022-04-20: qty 5

## 2022-04-20 MED ORDER — OXYCODONE HCL 5 MG PO TABS: 5.0000 mg | ORAL_TABLET | Freq: Once | ORAL | Status: DC | PRN

## 2022-04-20 MED ORDER — GABAPENTIN 300 MG PO CAPS: 300.0000 mg | ORAL_CAPSULE | Freq: Once | ORAL | Status: AC

## 2022-04-20 MED ORDER — DEXAMETHASONE SODIUM PHOSPHATE 10 MG/ML IJ SOLN
INTRAMUSCULAR | Status: DC | PRN
  Administered 2022-04-20: 10 mg via INTRAVENOUS

## 2022-04-20 MED ORDER — PROMETHAZINE HCL 25 MG/ML IJ SOLN: 6.2500 mg | INTRAMUSCULAR | Status: DC | PRN

## 2022-04-20 MED ORDER — PHENYLEPHRINE 80 MCG/ML (10ML) SYRINGE FOR IV PUSH (FOR BLOOD PRESSURE SUPPORT)
PREFILLED_SYRINGE | INTRAVENOUS | Status: AC
  Filled 2022-04-20: qty 10

## 2022-04-20 MED ORDER — CELECOXIB 200 MG PO CAPS
ORAL_CAPSULE | ORAL | Status: AC
  Administered 2022-04-20: 200 mg via ORAL
  Filled 2022-04-20: qty 1

## 2022-04-20 MED ORDER — ONDANSETRON HCL 4 MG/2ML IJ SOLN
INTRAMUSCULAR | Status: AC
  Filled 2022-04-20: qty 2

## 2022-04-20 MED ORDER — BUPIVACAINE HCL (PF) 0.5 % IJ SOLN
INTRAMUSCULAR | Status: AC
  Filled 2022-04-20: qty 30

## 2022-04-20 MED ORDER — GABAPENTIN 300 MG PO CAPS
ORAL_CAPSULE | ORAL | Status: AC
  Administered 2022-04-20: 300 mg via ORAL
  Filled 2022-04-20: qty 1

## 2022-04-20 MED ORDER — GLYCOPYRROLATE 0.2 MG/ML IJ SOLN
INTRAMUSCULAR | Status: AC
Start: 2022-04-20 — End: ?
  Filled 2022-04-20: qty 1

## 2022-04-20 MED ORDER — ROCURONIUM BROMIDE 10 MG/ML (PF) SYRINGE
PREFILLED_SYRINGE | INTRAVENOUS | Status: AC
  Filled 2022-04-20: qty 10

## 2022-04-20 MED ORDER — GLYCOPYRROLATE 0.2 MG/ML IJ SOLN
INTRAMUSCULAR | Status: DC | PRN
  Administered 2022-04-20: .2 mg via INTRAVENOUS

## 2022-04-20 MED ORDER — CHLORHEXIDINE GLUCONATE 0.12 % MT SOLN
OROMUCOSAL | Status: AC
  Administered 2022-04-20: 15 mL via OROMUCOSAL
  Filled 2022-04-20: qty 15

## 2022-04-20 MED ORDER — PHENYLEPHRINE 80 MCG/ML (10ML) SYRINGE FOR IV PUSH (FOR BLOOD PRESSURE SUPPORT)
PREFILLED_SYRINGE | INTRAVENOUS | Status: DC | PRN
  Administered 2022-04-20 (×3): 80 ug via INTRAVENOUS

## 2022-04-20 SURGICAL SUPPLY — 65 items
BACTOSHIELD CHG 4% 4OZ (MISCELLANEOUS) ×1
BAG URINE DRAIN 2000ML AR STRL (UROLOGICAL SUPPLIES) ×2 IMPLANT
BLADE SURG SZ11 CARB STEEL (BLADE) ×2 IMPLANT
CATH FOLEY 2WAY  5CC 16FR (CATHETERS) ×1
CATH URTH 16FR FL 2W BLN LF (CATHETERS) ×1 IMPLANT
CHLORAPREP W/TINT 26 (MISCELLANEOUS) ×2 IMPLANT
COVER MAYO STAND REUSABLE (DRAPES) ×2 IMPLANT
COVER TIP SHEARS 8 DVNC (MISCELLANEOUS) ×1 IMPLANT
COVER TIP SHEARS 8MM DA VINCI (MISCELLANEOUS) ×1
COVER WAND RF STERILE (DRAPES) ×2 IMPLANT
DEFOGGER SCOPE WARMER CLEARIFY (MISCELLANEOUS) ×2 IMPLANT
DERMABOND ADVANCED (GAUZE/BANDAGES/DRESSINGS) ×1
DERMABOND ADVANCED .7 DNX12 (GAUZE/BANDAGES/DRESSINGS) ×1 IMPLANT
DRAPE 3/4 80X56 (DRAPES) ×2 IMPLANT
DRAPE ARM DVNC X/XI (DISPOSABLE) ×3 IMPLANT
DRAPE COLUMN DVNC XI (DISPOSABLE) ×1 IMPLANT
DRAPE DA VINCI XI ARM (DISPOSABLE) ×3
DRAPE DA VINCI XI COLUMN (DISPOSABLE) ×1
DRAPE LEGGINS SURG 28X43 STRL (DRAPES) ×2 IMPLANT
DRAPE UNDER BUTTOCK W/FLU (DRAPES) ×2 IMPLANT
ELECT REM PT RETURN 9FT ADLT (ELECTROSURGICAL) ×2
ELECTRODE REM PT RTRN 9FT ADLT (ELECTROSURGICAL) ×1 IMPLANT
FILTER LAP SMOKE EVAC STRL (MISCELLANEOUS) ×2 IMPLANT
GLOVE BIO SURGEON STRL SZ7 (GLOVE) ×5 IMPLANT
GLOVE SURG UNDER POLY LF SZ7.5 (GLOVE) ×6 IMPLANT
GOWN STRL REUS W/ TWL LRG LVL3 (GOWN DISPOSABLE) ×3 IMPLANT
GOWN STRL REUS W/TWL LRG LVL3 (GOWN DISPOSABLE) ×3
GRASPER SUT TROCAR 14GX15 (MISCELLANEOUS) IMPLANT
GYRUS RUMI II 2.5CM BLUE (DISPOSABLE)
GYRUS RUMI II 3.5CM BLUE (DISPOSABLE)
IRRIGATION STRYKERFLOW (MISCELLANEOUS) IMPLANT
IRRIGATOR STRYKERFLOW (MISCELLANEOUS)
KIT IMAGING PINPOINTPAQ (MISCELLANEOUS) IMPLANT
KIT PINK PAD W/HEAD ARE REST (MISCELLANEOUS) ×2
KIT PINK PAD W/HEAD ARM REST (MISCELLANEOUS) ×1 IMPLANT
LABEL OR SOLS (LABEL) ×2 IMPLANT
MANIFOLD NEPTUNE II (INSTRUMENTS) ×2 IMPLANT
MANIPULATOR UTERINE 4.5 ZUMI (MISCELLANEOUS) ×1 IMPLANT
NEEDLE HYPO 22GX1.5 SAFETY (NEEDLE) ×2 IMPLANT
OBTURATOR OPTICAL STANDARD 8MM (TROCAR) ×1
OBTURATOR OPTICAL STND 8 DVNC (TROCAR) ×1
OBTURATOR OPTICALSTD 8 DVNC (TROCAR) ×1 IMPLANT
PACK LAP CHOLECYSTECTOMY (MISCELLANEOUS) ×2 IMPLANT
PAD ARMBOARD 7.5X6 YLW CONV (MISCELLANEOUS) ×2 IMPLANT
PAD OB MATERNITY 4.3X12.25 (PERSONAL CARE ITEMS) ×2 IMPLANT
PAD PREP 24X41 OB/GYN DISP (PERSONAL CARE ITEMS) ×2 IMPLANT
RUMI II 3.0CM BLUE KOH-EFFICIE (DISPOSABLE) IMPLANT
RUMI II GYRUS 2.5CM BLUE (DISPOSABLE) IMPLANT
RUMI II GYRUS 3.5CM BLUE (DISPOSABLE) IMPLANT
SCISSORS METZENBAUM CVD 33 (INSTRUMENTS) IMPLANT
SCRUB CHG 4% DYNA-HEX 4OZ (MISCELLANEOUS) ×1 IMPLANT
SEAL CANN UNIV 5-8 DVNC XI (MISCELLANEOUS) ×3 IMPLANT
SEAL XI 5MM-8MM UNIVERSAL (MISCELLANEOUS) ×3
SEALER VESSEL DA VINCI XI (MISCELLANEOUS) ×1
SEALER VESSEL EXT DVNC XI (MISCELLANEOUS) ×1 IMPLANT
SOLUTION ELECTROLUBE (MISCELLANEOUS) ×2 IMPLANT
SPONGE T-LAP 18X18 ~~LOC~~+RFID (SPONGE) ×2 IMPLANT
SURGILUBE 2OZ TUBE FLIPTOP (MISCELLANEOUS) ×2 IMPLANT
SUT MNCRL 4-0 (SUTURE) ×2
SUT MNCRL 4-0 27XMFL (SUTURE) ×2
SUT VICRYL 0 AB UR-6 (SUTURE) IMPLANT
SUTURE MNCRL 4-0 27XMF (SUTURE) ×1 IMPLANT
SYR 10ML LL (SYRINGE) ×2 IMPLANT
TOWEL OR 17X26 4PK STRL BLUE (TOWEL DISPOSABLE) ×2 IMPLANT
TUBING EVAC SMOKE HEATED PNEUM (TUBING) ×2 IMPLANT

## 2022-04-20 NOTE — Op Note (Signed)
Operative Note    Name: Caroline Crosby  Date of Service: 04/20/2022  DOB: 12-09-1975  MRN: [id number]   Pre-Operative Diagnosis: Desires permanent sterilization [Z30.2]  Post-Operative Diagnosis: Desires permanent sterilization [Z30.2]  Procedures:  Robot assisted laparoscopic bilateral salpingectomy  Primary Surgeon: Thomasene Mohair, MD   EBL: minimal  IVF: 1,000 mL   Urine output: 75 mL  Specimens: right and left fallopian tubes  Drains: none  Complications: None   Disposition: PACU   Condition: Stable   Findings:  1) normal appearing bilateral fallopian tubes and ovaries 2) uterus with a small anterior left fibroid (~1.5 cm), otherwise appeared normal 3) 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  Procedure Summary:  The patient was taken to the operating room where general anesthesia was administered and found to be adequate. She was placed in the dorsal supine lithotomy position in Stollings stirrups and prepped and draped in usual sterile fashion. After a timeout was called an indwelling catheter was placed in her bladder.  A sponge on a stick was placed in her vagina for uterine manipulation.  Attention was turned to the abdomen where after injection of local anesthetic, an 8 mm supraumbilical incision was made with the scalpel. Entry into the abdomen was obtained via Optiview trocar technique (a blunt entry technique with camera visualization through the obturator upon entry). Verification of entry into the abdomen was obtained using opening pressures. The abdomen was insufflated with CO2. The camera was introduced through the trocar with verification of atraumatic entry.  Right and left abdominal entry sites were created after injection of local anesthetic about 8 cm away from the umbilical port in accordance with the Intuitive manufacturer's recommendations.  The port sites were 8 mm.  The intuitive trochars were introduced  under intra-abdominal camera visualization without difficulty.   The robot was docked from the patient's left without difficulty. The camera was placed through port 3.  After targeting, the forced bipolar forceps were introduced through port 4 under direct intra-abdominal camera visualization.  The vessel sealer was introduced through port 2 under direct intra-abdominal camera visualization.  Survey of the pelvis was undertaken with the above-noted findings.  The distal left fallopian tube was elevated and in a lateral to medial fashion the mesosalpinx was cauterized and transected until the entire fallopian tube was liberated.  This was placed in the anterior cul-de-sac.  In a similar fashion the right fallopian tube was removed.  Hemostasis was noted at the fallopian tube removal sites.  All instruments were removed from the trocars, as well as the camera.  The robot was undocked.  The camera was reintroduced through the supraumbilical trocar and the fallopian tubes were removed through the right lower quadrant trocar without difficulty.  The abdomen was emptied of CO2.  All trocars were removed.  The skin sites were closed in a subcuticular fashion using 4-0 Monocryl and reinforced with surgical skin glue.  Attention was returned to the pelvis.  The Foley catheter was removed.  The sponge on a stick was removed from the vagina.  A digital sweep of the vagina was performed to ensure that no instruments or sponges remained in the vagina.  The patient tolerated the procedure well.  Sponge, lap, needle, and instrument counts were correct x 2.  VTE prophylaxis: SCDs. Antibiotic prophylaxis: none indicated and none given. She was awakened in the operating room and was taken to the PACU in stable condition.   Thomasene Mohair, MD 04/20/2022 8:52  AM

## 2022-04-20 NOTE — Anesthesia Procedure Notes (Signed)
Procedure Name: Intubation Date/Time: 04/20/2022 7:41 AM  Performed by: Irving Burton, CRNAPre-anesthesia Checklist: Patient identified, Patient being monitored, Timeout performed, Emergency Drugs available and Suction available Patient Re-evaluated:Patient Re-evaluated prior to induction Oxygen Delivery Method: Circle system utilized Preoxygenation: Pre-oxygenation with 100% oxygen Induction Type: IV induction Ventilation: Mask ventilation without difficulty Laryngoscope Size: 3 and McGraph Grade View: Grade I Tube type: Oral Tube size: 7.0 mm Number of attempts: 1 Airway Equipment and Method: Stylet and Video-laryngoscopy Placement Confirmation: ETT inserted through vocal cords under direct vision, positive ETCO2 and breath sounds checked- equal and bilateral Secured at: 20 cm Tube secured with: Tape Dental Injury: Teeth and Oropharynx as per pre-operative assessment  Difficulty Due To: Difficulty was anticipated

## 2022-04-20 NOTE — Interval H&P Note (Signed)
History and Physical Interval Note:   04/20/2022 7:28 AM    Caroline Crosby has presented today for surgery, with the diagnosis of desires sterilization.  The various methods of treatment have been discussed with the patient and family. After consideration of risks, benefits and other options for treatment, the patient has consented to  Procedure(s): Robot assisted laparoscopic bilateral salpingectomy as a surgical intervention.  The patient's history has been reviewed, patient examined, no change in status, stable for surgery.  I have reviewed the patient's chart and labs.  Questions were answered to the patient's satisfaction.  Consents reviewed and signed. She wishes to proceed.   Thomasene Mohair, MD, [facility] 04/20/2022 7:29 AM

## 2022-04-20 NOTE — Discharge Instructions (Signed)
AMBULATORY SURGERY  ?DISCHARGE INSTRUCTIONS ? ? ?The drugs that you were given will stay in your system until tomorrow so for the next 24 hours you should not: ? ?Drive an automobile ?Make any legal decisions ?Drink any alcoholic beverage ? ? ?You may resume regular meals tomorrow.  Today it is better to start with liquids and gradually work up to solid foods. ? ?You may eat anything you prefer, but it is better to start with liquids, then soup and crackers, and gradually work up to solid foods. ? ? ?Please notify your doctor immediately if you have any unusual bleeding, trouble breathing, redness and pain at the surgery site, drainage, fever, or pain not relieved by medication. ? ? ? ?Additional Instructions: ? ? ? ?Please contact your physician with any problems or Same Day Surgery at [phone], Monday through Friday 6 am to 4 pm, or [place] [facility] Main number at [phone].  ?

## 2022-04-20 NOTE — Transfer of Care (Signed)
Immediate Anesthesia Transfer of Care Note  Patient: Caroline Crosby  Procedure(s) Performed: XI ROBOTIC ASSISTED LAPAROSCOPIC BILATERAL SALPINGECTOMY (Bilateral: Abdomen)  Patient Location: PACU  Anesthesia Type:General  Level of Consciousness: awake, alert  and oriented  Airway & Oxygen Therapy: Patient Spontanous Breathing and Patient connected to face mask oxygen  Post-op Assessment: Report given to RN and Post -op Vital signs reviewed and stable  Post vital signs: Reviewed and stable  Last Vitals:  Vitals Value Taken Time  BP 143/97 04/20/22 0901  Temp 50f   Pulse 81   Resp 21   SpO2 98   Vitals shown include unvalidated device data.  Last Pain:  Vitals:   04/20/22 0637  TempSrc: Oral         Complications:  Encounter Notable Events  Notable Event Outcome Phase Comment  Difficult to intubate - expected  Intraprocedure Filed from anesthesia note documentation.

## 2022-04-21 ENCOUNTER — Encounter: Payer: Self-pay | Admitting: Obstetrics and Gynecology

## 2022-04-21 LAB — SURGICAL PATHOLOGY

## 2022-04-21 NOTE — Anesthesia Postprocedure Evaluation (Signed)
Anesthesia Post Note  Patient: Ozella D Pegg  Procedure(s) Performed: XI ROBOTIC ASSISTED LAPAROSCOPIC BILATERAL SALPINGECTOMY (Bilateral: Abdomen)  Patient location during evaluation: PACU Anesthesia Type: General Level of consciousness: awake and alert Pain management: pain level controlled Vital Signs Assessment: post-procedure vital signs reviewed and stable Respiratory status: spontaneous breathing, nonlabored ventilation and respiratory function stable Cardiovascular status: blood pressure returned to baseline and stable Postop Assessment: no apparent nausea or vomiting Anesthetic complications: yes   Encounter Notable Events  Notable Event Outcome Phase Comment  Difficult to intubate - expected  Intraprocedure Filed from anesthesia note documentation.     Last Vitals:  Vitals:   04/20/22 0930 04/20/22 0946  BP: (!) 148/92 (!) 145/99  Pulse: 66 72  Resp: 18 16  Temp: (!) 36.1 C (!) 36.1 C  SpO2: 98% 99%    Last Pain:  Vitals:   04/20/22 0946  TempSrc: Temporal  PainSc: 0-No pain                 Foye Deer

## 2022-04-26 ENCOUNTER — Ambulatory Visit: Payer: Self-pay | Admitting: Nurse Practitioner

## 2022-04-26 ENCOUNTER — Encounter: Payer: Self-pay | Admitting: Nurse Practitioner

## 2022-04-26 DIAGNOSIS — Z9079 Acquired absence of other genital organ(s): Secondary | ICD-10-CM | POA: Insufficient documentation

## 2022-04-26 DIAGNOSIS — Z113 Encounter for screening for infections with a predominantly sexual mode of transmission: Secondary | ICD-10-CM

## 2022-04-26 LAB — WET PREP FOR TRICH, YEAST, CLUE
Trichomonas Exam: NEGATIVE
Yeast Exam: NEGATIVE

## 2022-04-26 NOTE — Progress Notes (Signed)
[facility] Department  STI clinic/screening visit [address] [place] [address] [phone]  Subjective:  Caroline Crosby is a 46 y.o. female being seen today for an STI screening visit. The patient reports they do not have symptoms.  Patient reports that they do not desire a pregnancy in the next year.   They reported they are not interested in discussing contraception today.  Patient was a history of Salpingectomy.    Patient's last menstrual period was 04/05/2022 (exact date).   Patient has the following medical conditions:   Patient Active Problem List   Diagnosis Date Noted   H/O bilateral salpingectomy 04/26/2022   Admission for sterilization 04/20/2022   BMI 50.0-59.9, adult (HCC) 11/29/2019   Migraine, menstrual 11/29/2019    Chief Complaint  Patient presents with   SEXUALLY TRANSMITTED DISEASE    Screening    HPI  Patient reports to clinic today for STD screening.  Patient is currently asymptomatic.    Last HIV test per patient/review of record was 12/17/21. Patient reports last pap was 12/17/21.   Screening for MPX risk: Does the patient have an unexplained rash? No Is the patient MSM? No Does the patient endorse multiple sex partners or anonymous sex partners? No Did the patient have close or sexual contact with a person diagnosed with MPX? No Has the patient traveled outside the [place] where MPX is endemic? No Is there a high clinical suspicion for MPX-- evidenced by one of the following No  -Unlikely to be chickenpox  -Lymphadenopathy  -Rash that present in same phase of evolution on any given body part See flowsheet for further details and programmatic requirements.   Immunization history:  Immunization History  Administered Date(s) Administered   Hep A / Hep B 05/08/2008, 10/07/2009   Tdap 05/08/2008     The following portions of the patient's history were reviewed and updated as appropriate: allergies, current  medications, past medical history, past social history, past surgical history and problem list.  Objective:  There were no vitals filed for this visit.  Physical Exam Constitutional:      Appearance: Normal appearance.  HENT:     Head: Normocephalic. No abrasion, masses or laceration. Hair is normal.     Mouth/Throat:     Mouth: No oral lesions.     Dentition: No dental caries.     Pharynx: No oropharyngeal exudate or posterior oropharyngeal erythema.     Tonsils: No tonsillar exudate or tonsillar abscesses.  Eyes:     General: Lids are normal.        Right eye: No discharge.        Left eye: No discharge.     Conjunctiva/sclera: Conjunctivae normal.     Right eye: No exudate.    Left eye: No exudate. Abdominal:     General: Abdomen is flat.     Palpations: Abdomen is soft.     Tenderness: There is no abdominal tenderness. There is no rebound.  Genitourinary:    Pubic Area: No rash or pubic lice.      Labia:        Right: No rash, tenderness, lesion or injury.        Left: No rash, tenderness, lesion or injury.      Vagina: Normal. No vaginal discharge, erythema or lesions.     Cervix: No cervical motion tenderness, discharge, lesion or erythema.     Uterus: Not enlarged and not tender.      Rectum:  Normal.     Comments: Amount Discharge: small  Odor: No pH: less than 4.5 Adheres to vaginal wall: No Color: color of discharge matches the Mitchael Luckey swab  Lymphadenopathy:     Cervical: No cervical adenopathy.     Right cervical: No superficial, deep or posterior cervical adenopathy.    Left cervical: No superficial, deep or posterior cervical adenopathy.     Upper Body:     Right upper body: No supraclavicular, axillary or epitrochlear adenopathy.     Left upper body: No supraclavicular, axillary or epitrochlear adenopathy.     Lower Body: No right inguinal adenopathy. No left inguinal adenopathy.  Skin:    General: Skin is warm and dry.     Findings: No lesion or rash.   Neurological:     Mental Status: She is alert and oriented to person, place, and time.  Psychiatric:        Attention and Perception: Attention normal.        Mood and Affect: Mood normal.        Speech: Speech normal.        Behavior: Behavior normal. Behavior is cooperative.      Assessment and Plan:  Caroline Crosby is a 46 y.o. female presenting to the [facility] Department for STI screening  1. Screening examination for venereal disease -46 year old female in clinic for STD screening. -Patient accepted all screenings including oral GC, vaginal CT/GC, wet prep and declines bloodwork for HIV/RPR.  Patient meets criteria for HepB screening? Yes. Ordered? No - refused Patient meets criteria for HepC screening? Yes. Ordered? No - refused  Treat wet prep per standing order Discussed time line for [facility] results and that patient will be called with positive results and encouraged patient to call if she had not heard in 2 weeks.  Counseled to return or seek care for continued or worsening symptoms Recommended condom use with all sex  Patient is currently using Ster[facility] Women to prevent pregnancy.    - Gonococcus culture - WET PREP FOR TRICH, YEAST, CLUE - Chlamydia/Gonorrhea [facility]     Return if symptoms worsen or fail to improve.   Glenna Fellows, FNP

## 2022-05-01 LAB — GONOCOCCUS CULTURE

## 2023-09-05 ENCOUNTER — Other Ambulatory Visit: Payer: Self-pay | Admitting: Family Medicine

## 2023-09-05 DIAGNOSIS — Z1231 Encounter for screening mammogram for malignant neoplasm of breast: Secondary | ICD-10-CM

## 2023-11-17 ENCOUNTER — Encounter: Payer: Self-pay | Admitting: *Deleted

## 2023-11-25 ENCOUNTER — Ambulatory Visit
Admission: RE | Admit: 2023-11-25 | Discharge: 2023-11-25 | Disposition: A | Discharge status: Home / Self Care | Payer: BC Managed Care – PPO | Source: Ambulatory Visit | Attending: Gastroenterology | Admitting: Gastroenterology

## 2023-11-25 ENCOUNTER — Ambulatory Visit: Payer: BC Managed Care – PPO | Admitting: Anesthesiology

## 2023-11-25 ENCOUNTER — Encounter: Payer: Self-pay | Admitting: *Deleted

## 2023-11-25 ENCOUNTER — Encounter
Admission: RE | Disposition: A | Discharge status: Home / Self Care | Payer: Self-pay | Source: Ambulatory Visit | Attending: Gastroenterology

## 2023-11-25 ENCOUNTER — Other Ambulatory Visit: Payer: Self-pay

## 2023-11-25 DIAGNOSIS — K640 First degree hemorrhoids: Secondary | ICD-10-CM | POA: Diagnosis not present

## 2023-11-25 DIAGNOSIS — G4730 Sleep apnea, unspecified: Secondary | ICD-10-CM | POA: Insufficient documentation

## 2023-11-25 DIAGNOSIS — K5730 Diverticulosis of large intestine without perforation or abscess without bleeding: Secondary | ICD-10-CM | POA: Diagnosis not present

## 2023-11-25 DIAGNOSIS — Z6841 Body Mass Index (BMI) 40.0 and over, adult: Secondary | ICD-10-CM | POA: Diagnosis not present

## 2023-11-25 DIAGNOSIS — Z1211 Encounter for screening for malignant neoplasm of colon: Secondary | ICD-10-CM | POA: Diagnosis present

## 2023-11-25 DIAGNOSIS — E66813 Obesity, class 3: Secondary | ICD-10-CM | POA: Diagnosis not present

## 2023-11-25 DIAGNOSIS — Z87891 Personal history of nicotine dependence: Secondary | ICD-10-CM | POA: Insufficient documentation

## 2023-11-25 HISTORY — DX: Localized edema: R60.0

## 2023-11-25 HISTORY — PX: COLONOSCOPY WITH PROPOFOL: SHX5780

## 2023-11-25 SURGERY — COLONOSCOPY WITH PROPOFOL
Anesthesia: General

## 2023-11-25 MED ORDER — EPHEDRINE 5 MG/ML INJ
INTRAVENOUS | Status: AC
  Filled 2023-11-25: qty 5

## 2023-11-25 MED ORDER — PROPOFOL 500 MG/50ML IV EMUL
INTRAVENOUS | Status: DC | PRN
  Administered 2023-11-25: 50 ug/kg/min via INTRAVENOUS

## 2023-11-25 MED ORDER — SODIUM CHLORIDE 0.9 % IV SOLN: INTRAVENOUS | Status: DC

## 2023-11-25 MED ORDER — LIDOCAINE HCL (CARDIAC) PF 100 MG/5ML IV SOSY
PREFILLED_SYRINGE | INTRAVENOUS | Status: DC | PRN
  Administered 2023-11-25: 100 mg via INTRAVENOUS

## 2023-11-25 MED ORDER — GLYCOPYRROLATE 0.2 MG/ML IJ SOLN
INTRAMUSCULAR | Status: DC | PRN
  Administered 2023-11-25: .2 mg via INTRAVENOUS

## 2023-11-25 MED ORDER — DEXMEDETOMIDINE HCL IN NACL 80 MCG/20ML IV SOLN
INTRAVENOUS | Status: DC | PRN
  Administered 2023-11-25: 12 ug via INTRAVENOUS
  Administered 2023-11-25: 8 ug via INTRAVENOUS

## 2023-11-25 MED ORDER — PROPOFOL 10 MG/ML IV BOLUS
INTRAVENOUS | Status: DC | PRN
  Administered 2023-11-25 (×2): 30 mg via INTRAVENOUS
  Administered 2023-11-25: 40 mg via INTRAVENOUS

## 2023-11-25 MED ORDER — LIDOCAINE HCL (PF) 2 % IJ SOLN
INTRAMUSCULAR | Status: AC
  Filled 2023-11-25: qty 5

## 2023-11-25 NOTE — Anesthesia Preprocedure Evaluation (Signed)
Anesthesia Evaluation  Patient identified by MRN, date of birth, ID band Patient awake    Reviewed: Allergy & Precautions, NPO status , Patient's Chart, lab work & pertinent test results  History of Anesthesia Complications (+) DIFFICULT AIRWAY and history of anesthetic complications  Airway Mallampati: II  TM Distance: >3 FB Neck ROM: Full    Dental  (+) Teeth Intact   Pulmonary neg pulmonary ROS, sleep apnea , Patient abstained from smoking., former smoker   Pulmonary exam normal breath sounds clear to auscultation       Cardiovascular Exercise Tolerance: Good negative cardio ROS Normal cardiovascular exam Rhythm:Regular Rate:Normal     Neuro/Psych  Headaches negative neurological ROS  negative psych ROS   GI/Hepatic negative GI ROS, Neg liver ROS,,,  Endo/Other  negative endocrine ROS  Class 4 obesity  Renal/GU negative Renal ROS  negative genitourinary   Musculoskeletal negative musculoskeletal ROS (+)    Abdominal  (+) + obese  Peds negative pediatric ROS (+)  Hematology negative hematology ROS (+)   Anesthesia Other Findings Past Medical History: No date: Bilateral lower extremity edema 11/29/2019: BMI 50.0-59.9, adult (HCC) 11/29/2019: Migraine, menstrual No date: Morbid obesity with BMI of 50.0-59.9, adult [facility])  Past Surgical History: 1995: CESAREAN SECTION 1988: TONSILLECTOMY 04/20/2022: XI ROBOTIC ASSISTED SALPINGECTOMY; Bilateral     Comment:  Procedure: XI ROBOTIC ASSISTED LAPAROSCOPIC BILATERAL               SALPINGECTOMY;  Surgeon: Conard Novak, MD;                Location: [facility] ORS;  Service: Gynecology;  Laterality:               Bilateral;  BMI    Body Mass Index: 52.43 kg/m      Reproductive/Obstetrics negative OB ROS                             Anesthesia Physical Anesthesia Plan  ASA: 3  Anesthesia Plan: General   Post-op Pain Management:     Induction: Intravenous  PONV Risk Score and Plan: Propofol infusion and TIVA  Airway Management Planned: Natural Airway and Nasal Cannula  Additional Equipment:   Intra-op Plan:   Post-operative Plan:   Informed Consent: I have reviewed the patients History and Physical, chart, labs and discussed the procedure including the risks, benefits and alternatives for the proposed anesthesia with the patient or authorized representative who has indicated his/her understanding and acceptance.     Dental Advisory Given  Plan Discussed with: Anesthesiologist, CRNA and Surgeon  Anesthesia Plan Comments:        Anesthesia Quick Evaluation

## 2023-11-25 NOTE — H&P (Signed)
Outpatient short stay form Pre-procedure 11/25/2023  Regis Bill, MD  Primary Physician: [facility]  Reason for visit:  Screening  History of present illness:    48 y/o lady with history of obesity here for index screening colonoscopy. No blood thinners. No family history of GI malignancies. History of tubal ligation.    Current Facility-Administered Medications:    0.9 %  sodium chloride infusion, , Intravenous, Continuous, Kresta Templeman, Rossie Muskrat, MD, Last Rate: 20 mL/hr at 11/25/23 1035, New Bag at 11/25/23 1035  Medications Prior to Admission  Medication Sig Dispense Refill Last Dose/Taking   naproxen sodium (ALEVE) 220 MG tablet Take 220 mg by mouth daily as needed.   Taking As Needed   HYDROcodone-acetaminophen (NORCO) 10-325 MG tablet Take 1 tablet by mouth as directed.      ibuprofen (ADVIL) 600 MG tablet Take 1 tablet (600 mg total) by mouth every 6 (six) hours. 30 tablet 0      Allergies  Allergen Reactions   Bee Venom Anaphylaxis     Past Medical History:  Diagnosis Date   Bilateral lower extremity edema    BMI 50.0-59.9, adult (HCC) 11/29/2019   Migraine, menstrual 11/29/2019   Morbid obesity with BMI of 50.0-59.9, adult (HCC)     Review of systems:  Otherwise negative.    Physical Exam  Gen: Alert, oriented. Appears stated age.  HEENT: PERRLA. Lungs: No respiratory distress CV: RRR Abd: soft, benign, no masses Ext: No edema    Planned procedures: Proceed with colonoscopy. The patient understands the nature of the planned procedure, indications, risks, alternatives and potential complications including but not limited to bleeding, infection, perforation, damage to internal organs and possible oversedation/side effects from anesthesia. The patient agrees and gives consent to proceed.  Please refer to procedure notes for findings, recommendations and patient disposition/instructions.     Regis Bill, MD [facility]  Gastroenterology

## 2023-11-25 NOTE — Interval H&P Note (Signed)
History and Physical Interval Note:  11/25/2023 10:58 AM  Caroline Crosby  has presented today for surgery, with the diagnosis of Z12.11 (ICD-10-CM) - Colon cancer screening.  The various methods of treatment have been discussed with the patient and family. After consideration of risks, benefits and other options for treatment, the patient has consented to  Procedure(s): COLONOSCOPY WITH PROPOFOL (N/A) as a surgical intervention.  The patient's history has been reviewed, patient examined, no change in status, stable for surgery.  I have reviewed the patient's chart and labs.  Questions were answered to the patient's satisfaction.     Regis Bill  Ok to proceed with colonoscopy

## 2023-11-25 NOTE — Transfer of Care (Signed)
Immediate Anesthesia Transfer of Care Note  Patient: Caroline Crosby  Procedure(s) Performed: COLONOSCOPY WITH PROPOFOL  Patient Location: PACU  Anesthesia Type:General  Level of Consciousness: sedated  Airway & Oxygen Therapy: Patient Spontanous Breathing  Post-op Assessment: Report given to RN and Post -op Vital signs reviewed and stable  Post vital signs: Reviewed and stable  Last Vitals:  Vitals Value Taken Time  BP 97/53 11/25/23 1123  Temp    Pulse 68 11/25/23 1123  Resp 24 11/25/23 1123  SpO2 95 % 11/25/23 1123  Vitals shown include unfiled device data.  Last Pain:  Vitals:   11/25/23 1123  TempSrc:   PainSc: Asleep         Complications: No notable events documented.

## 2023-11-25 NOTE — Anesthesia Postprocedure Evaluation (Signed)
Anesthesia Post Note  Patient: Caroline Crosby  Procedure(s) Performed: COLONOSCOPY WITH PROPOFOL  Patient location during evaluation: PACU Anesthesia Type: General Level of consciousness: awake and awake and alert Pain management: pain level controlled Vital Signs Assessment: post-procedure vital signs reviewed and stable Respiratory status: spontaneous breathing Cardiovascular status: stable Anesthetic complications: no   No notable events documented.   Last Vitals:  Vitals:   11/25/23 1123 11/25/23 1133  BP: (!) 97/53 110/62  Pulse: 70 66  Resp: 15 17  Temp:    SpO2: 97% 100%    Last Pain:  Vitals:   11/25/23 1133  TempSrc:   PainSc: 0-No pain                 VAN STAVEREN,Jourdan Maldonado

## 2023-11-25 NOTE — Op Note (Signed)
[facility] Gastroenterology Patient Name: Caroline Crosby Procedure Date: 11/25/2023 10:51 AM MRN: [id number] Account #: [id number] Date of Birth: 03/18/1976 Admit Type: Outpatient Age: 48 Room: [facility] ENDO ROOM 3 Gender: Female Note Status: Finalized Instrument Name: Nelda Marseille [id number] Procedure:             Colonoscopy Indications:           Screening for colorectal malignant neoplasm Providers:             Eather Colas MD, MD Referring MD:          [facility] Medicines:             Monitored Anesthesia Care Complications:         No immediate complications. Procedure:             Pre-Anesthesia Assessment:                        - Prior to the procedure, a History and Physical was                         performed, and patient medications and allergies were                         reviewed. The patient is competent. The risks and                         benefits of the procedure and the sedation options and                         risks were discussed with the patient. All questions                         were answered and informed consent was obtained.                         Patient identification and proposed procedure were                         verified by the physician, the nurse, the                         anesthesiologist, the anesthetist and the technician                         in the endoscopy suite. Mental Status Examination:                         alert and oriented. Airway Examination: normal                         oropharyngeal airway and neck mobility. Respiratory                         Examination: clear to auscultation. CV Examination:                         normal. Prophylactic Antibiotics: The patient does not  require prophylactic antibiotics. Prior                         Anticoagulants: The patient has taken no anticoagulant                         or antiplatelet agents. ASA Grade Assessment: III - A                          patient with severe systemic disease. After reviewing                         the risks and benefits, the patient was deemed in                         satisfactory condition to undergo the procedure. The                         anesthesia plan was to use monitored anesthesia care                         (MAC). Immediately prior to administration of                         medications, the patient was re-assessed for adequacy                         to receive sedatives. The heart rate, respiratory                         rate, oxygen saturations, blood pressure, adequacy of                         pulmonary ventilation, and response to care were                         monitored throughout the procedure. The physical                         status of the patient was re-assessed after the                         procedure.                        After obtaining informed consent, the colonoscope was                         passed under direct vision. Throughout the procedure,                         the patient's blood pressure, pulse, and oxygen                         saturations were monitored continuously. The                         Colonoscope was introduced through the anus and  advanced to the the terminal ileum, with                         identification of the appendiceal orifice and IC                         valve. The colonoscopy was performed without                         difficulty. The patient tolerated the procedure well.                         The quality of the bowel preparation was excellent.                         The terminal ileum, ileocecal valve, appendiceal                         orifice, and rectum were photographed. Findings:      The perianal and digital rectal examinations were normal.      The terminal ileum appeared normal.      Scattered small-mouthed diverticula were found in the sigmoid colon,        descending colon and transverse colon.      Internal hemorrhoids were found during retroflexion. The hemorrhoids       were Grade I (internal hemorrhoids that do not prolapse).      The exam was otherwise without abnormality on direct and retroflexion       views. Impression:            - The examined portion of the ileum was normal.                        - Diverticulosis in the sigmoid colon, in the                         descending colon and in the transverse colon.                        - Internal hemorrhoids.                        - The examination was otherwise normal on direct and                         retroflexion views.                        - No specimens collected. Recommendation:        - Discharge patient to home.                        - Resume previous diet.                        - Continue present medications.                        - Repeat colonoscopy in 10 years for screening  purposes.                        - Return to referring physician as previously                         scheduled. Procedure Code(s):     --- Professional ---                        Z6109, Colorectal cancer screening; colonoscopy on                         individual not meeting criteria for high risk Diagnosis Code(s):     --- Professional ---                        Z12.11, Encounter for screening for malignant neoplasm                         of colon                        K64.0, First degree hemorrhoids                        K57.30, Diverticulosis of large intestine without                         perforation or abscess without bleeding CPT copyright 2022 [employer]. All rights reserved. The codes documented in this report are preliminary and upon coder review may  be revised to meet current compliance requirements. Eather Colas MD, MD 11/25/2023 11:23:52 AM Number of Addenda: 0 Note Initiated On: 11/25/2023 10:51 AM Scope Withdrawal  Time: 0 hours 8 minutes 15 seconds  Total Procedure Duration: 0 hours 10 minutes 42 seconds  Estimated Blood Loss:  Estimated blood loss: none.      [facility]

## 2023-11-28 ENCOUNTER — Encounter: Payer: Self-pay | Admitting: Gastroenterology
